# Patient Record
Sex: Female | Born: 1985 | Race: Black or African American | Hispanic: No | Marital: Single | Smoking: Current every day smoker
Health system: Southern US, Community
[De-identification: ages and names within clinical notes are randomized; demographics above are authoritative.]

## PROBLEM LIST (undated history)

## (undated) DIAGNOSIS — R87619 Unspecified abnormal cytological findings in specimens from cervix uteri: Secondary | ICD-10-CM

## (undated) DIAGNOSIS — G40909 Epilepsy, unspecified, not intractable, without status epilepticus: Secondary | ICD-10-CM

## (undated) DIAGNOSIS — F53 Postpartum depression: Secondary | ICD-10-CM

## (undated) DIAGNOSIS — O99345 Other mental disorders complicating the puerperium: Secondary | ICD-10-CM

## (undated) DIAGNOSIS — F419 Anxiety disorder, unspecified: Secondary | ICD-10-CM

## (undated) DIAGNOSIS — IMO0002 Reserved for concepts with insufficient information to code with codable children: Secondary | ICD-10-CM

## (undated) DIAGNOSIS — D573 Sickle-cell trait: Secondary | ICD-10-CM

## (undated) DIAGNOSIS — R569 Unspecified convulsions: Secondary | ICD-10-CM

## (undated) HISTORY — PX: CERVICAL CERCLAGE: SHX1329

## (undated) HISTORY — PX: OTHER SURGICAL HISTORY: SHX169

## (undated) HISTORY — PX: LEEP: SHX91

---

## 2005-10-26 ENCOUNTER — Emergency Department (HOSPITAL_COMMUNITY): Admission: EM | Admit: 2005-10-26 | Discharge: 2005-10-26 | Payer: Self-pay | Admitting: Emergency Medicine

## 2005-10-26 ENCOUNTER — Emergency Department (HOSPITAL_COMMUNITY): Admission: EM | Admit: 2005-10-26 | Discharge: 2005-10-26 | Payer: Self-pay | Admitting: *Deleted

## 2008-01-10 ENCOUNTER — Emergency Department (HOSPITAL_BASED_OUTPATIENT_CLINIC_OR_DEPARTMENT_OTHER): Admission: EM | Admit: 2008-01-10 | Discharge: 2008-01-10 | Payer: Self-pay | Admitting: Emergency Medicine

## 2011-01-02 LAB — OB RESULTS CONSOLE ABO/RH: RH Type: POSITIVE

## 2011-01-02 LAB — OB RESULTS CONSOLE HIV ANTIBODY (ROUTINE TESTING): HIV: NONREACTIVE

## 2011-03-31 NOTE — L&D Delivery Note (Signed)
Delivery Note At 5:14 AM a viable and healthy female was delivered via Vaginal, Spontaneous Delivery (Presentation:LOA->OA ).  APGAR: 8, 9; weight . pending Placenta status: Intact, Spontaneous.  Cord: 3V with the following complications: Marland Kitchen  Marginal insertion  Anesthesia: Epidural  Episiotomy: None Lacerations: 1st degree Suture Repair: 3.0 vicryl rapide Est. Blood Loss (mL): 250  Mom to postpartum.  Baby to nursery-stable.  Kameran Mcneese 08/18/2011, 5:50 AM

## 2011-05-20 LAB — OB RESULTS CONSOLE HIV ANTIBODY (ROUTINE TESTING): HIV: NONREACTIVE

## 2011-08-05 LAB — OB RESULTS CONSOLE GC/CHLAMYDIA: Chlamydia: NEGATIVE

## 2011-08-17 ENCOUNTER — Encounter (HOSPITAL_COMMUNITY): Payer: Self-pay | Admitting: *Deleted

## 2011-08-17 ENCOUNTER — Inpatient Hospital Stay (HOSPITAL_COMMUNITY)
Admission: AD | Admit: 2011-08-17 | Discharge: 2011-08-20 | DRG: 775 | Disposition: A | Discharge status: Home / Self Care | Payer: Medicaid Other | Source: Ambulatory Visit | Attending: Obstetrics & Gynecology | Admitting: Obstetrics & Gynecology

## 2011-08-17 HISTORY — DX: Reserved for concepts with insufficient information to code with codable children: IMO0002

## 2011-08-17 HISTORY — DX: Epilepsy, unspecified, not intractable, without status epilepticus: G40.909

## 2011-08-17 HISTORY — DX: Other mental disorders complicating the puerperium: O99.345

## 2011-08-17 HISTORY — DX: Unspecified convulsions: R56.9

## 2011-08-17 HISTORY — DX: Anxiety disorder, unspecified: F41.9

## 2011-08-17 HISTORY — DX: Sickle-cell trait: D57.3

## 2011-08-17 HISTORY — DX: Postpartum depression: F53.0

## 2011-08-17 HISTORY — DX: Unspecified abnormal cytological findings in specimens from cervix uteri: R87.619

## 2011-08-17 NOTE — MAU Provider Note (Signed)
See Admission H&P for this 26 yo G2P0101 at [redacted]w[redacted]d in active labor

## 2011-08-17 NOTE — MAU Note (Signed)
Arrived via EMS in labor

## 2011-08-18 ENCOUNTER — Encounter (HOSPITAL_COMMUNITY): Payer: Self-pay | Admitting: Anesthesiology

## 2011-08-18 ENCOUNTER — Inpatient Hospital Stay (HOSPITAL_COMMUNITY): Payer: Medicaid Other | Admitting: Anesthesiology

## 2011-08-18 ENCOUNTER — Encounter (HOSPITAL_COMMUNITY): Payer: Self-pay | Admitting: *Deleted

## 2011-08-18 LAB — CBC
HCT: 31.5 % — ABNORMAL LOW (ref 36.0–46.0)
Hemoglobin: 9.9 g/dL — ABNORMAL LOW (ref 12.0–15.0)
MCH: 26.5 pg (ref 26.0–34.0)
MCHC: 31.4 g/dL (ref 30.0–36.0)
MCV: 84.5 fL (ref 78.0–100.0)
RDW: 14 % (ref 11.5–15.5)

## 2011-08-18 MED ORDER — LANOLIN HYDROUS EX OINT: TOPICAL_OINTMENT | CUTANEOUS | Status: DC | PRN

## 2011-08-18 MED ORDER — OXYTOCIN BOLUS FROM INFUSION
500.0000 mL | Freq: Once | INTRAVENOUS | Status: DC
  Filled 2011-08-18: qty 1000
  Filled 2011-08-18: qty 500

## 2011-08-18 MED ORDER — DIPHENHYDRAMINE HCL 50 MG/ML IJ SOLN: 12.5000 mg | INTRAMUSCULAR | Status: DC | PRN

## 2011-08-18 MED ORDER — ONDANSETRON HCL 4 MG/2ML IJ SOLN: 4.0000 mg | Freq: Four times a day (QID) | INTRAMUSCULAR | Status: DC | PRN

## 2011-08-18 MED ORDER — EPHEDRINE 5 MG/ML INJ: 10.0000 mg | INTRAVENOUS | Status: DC | PRN

## 2011-08-18 MED ORDER — DIPHENHYDRAMINE HCL 25 MG PO CAPS: 25.0000 mg | ORAL_CAPSULE | Freq: Four times a day (QID) | ORAL | Status: DC | PRN

## 2011-08-18 MED ORDER — TETANUS-DIPHTH-ACELL PERTUSSIS 5-2.5-18.5 LF-MCG/0.5 IM SUSP
0.5000 mL | Freq: Once | INTRAMUSCULAR | Status: AC
  Administered 2011-08-19: 0.5 mL via INTRAMUSCULAR
  Filled 2011-08-18: qty 0.5

## 2011-08-18 MED ORDER — LAMOTRIGINE 100 MG PO TABS
100.0000 mg | ORAL_TABLET | Freq: Every day | ORAL | Status: DC
  Administered 2011-08-18 – 2011-08-19 (×2): 100 mg via ORAL
  Filled 2011-08-18 (×3): qty 1

## 2011-08-18 MED ORDER — LACTATED RINGERS IV SOLN: 500.0000 mL | INTRAVENOUS | Status: DC | PRN

## 2011-08-18 MED ORDER — PRENATAL MULTIVITAMIN CH
1.0000 | ORAL_TABLET | Freq: Every day | ORAL | Status: DC
  Administered 2011-08-18 – 2011-08-20 (×3): 1 via ORAL
  Filled 2011-08-18 (×3): qty 1

## 2011-08-18 MED ORDER — FENTANYL 2.5 MCG/ML BUPIVACAINE 1/10 % EPIDURAL INFUSION (WH - ANES)
14.0000 mL/h | INTRAMUSCULAR | Status: DC
  Administered 2011-08-18: 14 mL/h via EPIDURAL
  Filled 2011-08-18 (×2): qty 60

## 2011-08-18 MED ORDER — ZOLPIDEM TARTRATE 10 MG PO TABS
10.0000 mg | ORAL_TABLET | Freq: Every evening | ORAL | Status: DC | PRN
  Administered 2011-08-19: 10 mg via ORAL
  Filled 2011-08-18: qty 1

## 2011-08-18 MED ORDER — CITRIC ACID-SODIUM CITRATE 334-500 MG/5ML PO SOLN: 30.0000 mL | ORAL | Status: DC | PRN

## 2011-08-18 MED ORDER — FLEET ENEMA 7-19 GM/118ML RE ENEM: 1.0000 | ENEMA | RECTAL | Status: DC | PRN

## 2011-08-18 MED ORDER — IBUPROFEN 600 MG PO TABS
600.0000 mg | ORAL_TABLET | Freq: Four times a day (QID) | ORAL | Status: DC
  Administered 2011-08-18 – 2011-08-20 (×10): 600 mg via ORAL
  Filled 2011-08-18 (×11): qty 1

## 2011-08-18 MED ORDER — SIMETHICONE 80 MG PO CHEW: 80.0000 mg | CHEWABLE_TABLET | ORAL | Status: DC | PRN

## 2011-08-18 MED ORDER — SENNOSIDES-DOCUSATE SODIUM 8.6-50 MG PO TABS: 2.0000 | ORAL_TABLET | Freq: Every day | ORAL | Status: DC

## 2011-08-18 MED ORDER — SODIUM BICARBONATE 8.4 % IV SOLN
INTRAVENOUS | Status: DC | PRN
  Administered 2011-08-18: 4 mL via EPIDURAL

## 2011-08-18 MED ORDER — WITCH HAZEL-GLYCERIN EX PADS: 1.0000 "application " | MEDICATED_PAD | CUTANEOUS | Status: DC | PRN

## 2011-08-18 MED ORDER — BENZOCAINE-MENTHOL 20-0.5 % EX AERO
1.0000 "application " | INHALATION_SPRAY | CUTANEOUS | Status: DC | PRN
  Filled 2011-08-18: qty 56

## 2011-08-18 MED ORDER — PHENYLEPHRINE 40 MCG/ML (10ML) SYRINGE FOR IV PUSH (FOR BLOOD PRESSURE SUPPORT): 80.0000 ug | PREFILLED_SYRINGE | INTRAVENOUS | Status: DC | PRN

## 2011-08-18 MED ORDER — DIBUCAINE 1 % RE OINT: 1.0000 "application " | TOPICAL_OINTMENT | RECTAL | Status: DC | PRN

## 2011-08-18 MED ORDER — ACETAMINOPHEN 325 MG PO TABS: 650.0000 mg | ORAL_TABLET | ORAL | Status: DC | PRN

## 2011-08-18 MED ORDER — IBUPROFEN 600 MG PO TABS: 600.0000 mg | ORAL_TABLET | Freq: Four times a day (QID) | ORAL | Status: DC | PRN

## 2011-08-18 MED ORDER — OXYTOCIN 20 UNITS IN LACTATED RINGERS INFUSION - SIMPLE: 125.0000 mL/h | Freq: Once | INTRAVENOUS | Status: DC

## 2011-08-18 MED ORDER — ALBUTEROL SULFATE HFA 108 (90 BASE) MCG/ACT IN AERS
2.0000 | INHALATION_SPRAY | RESPIRATORY_TRACT | Status: DC | PRN
  Filled 2011-08-18: qty 6.7

## 2011-08-18 MED ORDER — LACTATED RINGERS IV SOLN
500.0000 mL | Freq: Once | INTRAVENOUS | Status: AC
  Administered 2011-08-18: 500 mL via INTRAVENOUS

## 2011-08-18 MED ORDER — PNEUMOCOCCAL VAC POLYVALENT 25 MCG/0.5ML IJ INJ: 0.5000 mL | INJECTION | Freq: Once | INTRAMUSCULAR | Status: DC

## 2011-08-18 MED ORDER — LIDOCAINE HCL (PF) 1 % IJ SOLN
30.0000 mL | INTRAMUSCULAR | Status: DC | PRN
  Filled 2011-08-18: qty 30

## 2011-08-18 MED ORDER — PNEUMOCOCCAL VAC POLYVALENT 25 MCG/0.5ML IJ INJ
0.5000 mL | INJECTION | Freq: Once | INTRAMUSCULAR | Status: DC
  Filled 2011-08-18: qty 0.5

## 2011-08-18 MED ORDER — ONDANSETRON HCL 4 MG PO TABS: 4.0000 mg | ORAL_TABLET | ORAL | Status: DC | PRN

## 2011-08-18 MED ORDER — OXYCODONE-ACETAMINOPHEN 5-325 MG PO TABS
1.0000 | ORAL_TABLET | ORAL | Status: DC | PRN
  Administered 2011-08-18: 1 via ORAL
  Filled 2011-08-18: qty 1

## 2011-08-18 MED ORDER — OXYCODONE-ACETAMINOPHEN 5-325 MG PO TABS
1.0000 | ORAL_TABLET | ORAL | Status: DC | PRN
  Administered 2011-08-18 – 2011-08-19 (×4): 2 via ORAL
  Administered 2011-08-19 – 2011-08-20 (×3): 1 via ORAL
  Filled 2011-08-18: qty 1
  Filled 2011-08-18: qty 2
  Filled 2011-08-18: qty 1
  Filled 2011-08-18 (×2): qty 2
  Filled 2011-08-18 (×3): qty 1

## 2011-08-18 MED ORDER — LACTATED RINGERS IV SOLN
INTRAVENOUS | Status: DC
  Administered 2011-08-18 (×2): via INTRAVENOUS

## 2011-08-18 MED ORDER — ONDANSETRON HCL 4 MG/2ML IJ SOLN: 4.0000 mg | INTRAMUSCULAR | Status: DC | PRN

## 2011-08-18 MED ORDER — EPHEDRINE 5 MG/ML INJ
10.0000 mg | INTRAVENOUS | Status: DC | PRN
  Filled 2011-08-18: qty 4

## 2011-08-18 MED ORDER — PHENYLEPHRINE 40 MCG/ML (10ML) SYRINGE FOR IV PUSH (FOR BLOOD PRESSURE SUPPORT)
80.0000 ug | PREFILLED_SYRINGE | INTRAVENOUS | Status: DC | PRN
  Filled 2011-08-18: qty 5

## 2011-08-18 NOTE — Anesthesia Procedure Notes (Signed)

## 2011-08-18 NOTE — Anesthesia Postprocedure Evaluation (Signed)
  Anesthesia Post Note  Patient: Sarah Cain  Procedure(s) Performed: * No procedures listed *  Anesthesia type: Epidural  Patient location: Mother/Baby  Post pain: Pain level controlled  Post assessment: Post-op Vital signs reviewed  Last Vitals:  Filed Vitals:   08/18/11 0835  BP: 123/79  Pulse: 85  Temp: 36.4 C  Resp: 18    Post vital signs: Reviewed  Level of consciousness:alert  Complications: No apparent anesthesia complications

## 2011-08-18 NOTE — H&P (Signed)
Attestation of Attending Supervision of Advanced Practitioner: Evaluation and management procedures were performed by the [facility] Fellow/PA/CNM/NP under my supervision and collaboration. Chart reviewed, and agree with management and plan.  Jaynie Collins, M.D. 08/18/2011 7:59 AM

## 2011-08-18 NOTE — Anesthesia Preprocedure Evaluation (Signed)
Anesthesia Evaluation  Patient identified by MRN, date of birth, ID band Patient awake    Reviewed: Allergy & Precautions, H&P , Patient's Chart, lab work & pertinent test results  Airway Mallampati: II TM Distance: >3 FB Neck ROM: full    Dental  (+) Teeth Intact   Pulmonary asthma ,  breath sounds clear to auscultation        Cardiovascular Rhythm:regular Rate:Normal     Neuro/Psych Seizures -,  PSYCHIATRIC DISORDERS    GI/Hepatic   Endo/Other    Renal/GU      Musculoskeletal   Abdominal   Peds  Hematology   Anesthesia Other Findings       Reproductive/Obstetrics (+) Pregnancy                           Anesthesia Physical Anesthesia Plan  ASA: III  Anesthesia Plan: Epidural   Post-op Pain Management:    Induction:   Airway Management Planned:   Additional Equipment:   Intra-op Plan:   Post-operative Plan:   Informed Consent: I have reviewed the patients History and Physical, chart, labs and discussed the procedure including the risks, benefits and alternatives for the proposed anesthesia with the patient or authorized representative who has indicated his/her understanding and acceptance.   Dental Advisory Given  Plan Discussed with:   Anesthesia Plan Comments: (Labs checked- platelets confirmed with RN in room. Fetal heart tracing, per RN, reported to be stable enough for sitting procedure. Discussed epidural, and patient consents to the procedure:  included risk of possible headache,backache, failed block, allergic reaction, and nerve injury. This patient was asked if she had any questions or concerns before the procedure started. )        Anesthesia Quick Evaluation

## 2011-08-18 NOTE — MAU Provider Note (Signed)
Attestation of Attending Supervision of Advanced Practitioner: Evaluation and management procedures were performed by the OB Fellow/PA/CNM/NP under my supervision and collaboration. Chart reviewed, and agree with management and plan.  Jaylenne Hamelin, M.D. 08/18/2011 8:01 AM   

## 2011-08-18 NOTE — Progress Notes (Signed)
UR chart review completed.  

## 2011-08-18 NOTE — H&P (Signed)
Sarah Cain is a 26 y.o. [place] W0981 @ 37'3 by LMP/9wk [place] with a chief complaint of painful ctx, q3 min, since 2100.  Minimal leakage of some fluid earlier today, no gush.  No VB.  +FM.  No CP, SOB, RUQ pain, vision changes, HA or face/hand edema.  Last cervical exam 1 wk ago was 3.5cm.  PN issues: -care at [facility]. -dating by LMP 11/29/10-> EDD 09/05/11, confirmed by 9wk [place]. -h/o 32wk PTD/PPROM/cervical incompetency/cerclage in last delivery: had cerclage this pregnancy 14-36wks.  Tried 17-OHP x 1 dose but could not continue due to logistical problems. -h/o simple partial seizure disorder: stopped dilantin at diagnosis of pregnancy, followed by neurology, was started on lamictal, still having seizures, as often as qday.    History OB History    Grav Para Term Preterm Abortions TAB SAB Ect Mult Living   4 1  1 2 1 1   1   Uncomplicated SVD @ 32wks   Past Medical History  Diagnosis Date  . Asthma   . Abnormal Pap smear   . Epilepsy   . Sickle cell trait   . Anxiety   . Post partum depression   . Seizures     states last seizure was on 5/20  PCOS- has been on OCPs in the past  Past Surgical History  Procedure Date  . Leep   . Dilation and curetage   . Cervical cerclage    GYN Hx: h/o HPV and chlamydia years ago (treated).  No h/o HSV.  H/o abnl pap and LEEP. Family History: positive for sickle cell trait, DM, HTN, CAD, alzheimers. Social History:  reports that she has been smoking Cigarettes- less than one per day.  She does not have any smokeless tobacco history on file. She reports that she does not drink alcohol or use illicit drugs.  Review of Systems  Constitutional: Negative for fever and chills.  HENT: Negative for sore throat.   Eyes: Negative for blurred vision and double vision.  Respiratory: Negative for cough and wheezing.   Cardiovascular: Negative for chest pain and palpitations.  Gastrointestinal: Negative for vomiting.  Genitourinary:  Negative for dysuria and urgency.  Musculoskeletal: Negative for myalgias and joint pain.  Skin: Negative for itching and rash.  Neurological: Positive for dizziness and seizures. Negative for headaches.  Endo/Heme/Allergies: Negative for polydipsia. Does not bruise/bleed easily.    Blood pressure 125/76, pulse 93, temperature 97.9 F (36.6 C), temperature source Oral, resp. rate 20, height 5\' 4"  (1.626 m), weight 87.998 kg (194 lb). Exam Physical Exam  Constitutional: She is oriented to person, place, and time. No distress.  HENT:  Head: Normocephalic and atraumatic.  Eyes: Conjunctivae and EOM are normal.  Neck: Normal range of motion. Neck supple.  Cardiovascular: Normal rate and normal heart sounds.   Respiratory: Effort normal and breath sounds normal.  GI:       Gravid, EFW 6lb, no RUQ tenderness  Genitourinary: Vagina normal.  Musculoskeletal: Normal range of motion.       No pitting edema  Neurological: She is alert and oriented to person, place, and time.  Skin: Skin is warm and dry. She is not diaphoretic.  Psychiatric: She has a normal mood and affect. Her behavior is normal.    SVE: 6.5/100/0/vtx/bulging bag @ 00:20 EFM: baseline 145, moderate variability, +accels, no decels Toco: q 2-3 min  Prenatal labs: ABO, Rh:   B+ Antibody:   neg Rubella:   immune RPR:  NR HBsAg:   neg HIV:   neg GBS:   neg GC/CT: neg GTT: never completed due to nausea  Assessment/Plan: Sarah Cain is a 26 y.o. G4 P0121 @ 37'3 by LMP/9wk [place] admitted in active labor.  -Labor: does not appear to have SROM'd.  Will continue expectant mgmt.  FOB is en route.  Anticipate SVD. -Pain control: desires epidural. -FWB: vtx, GBS negative, cat 1 tracing, term. -h/o simple partial seizure disorder: followed by neurology, on lamictal, still having seizures, as often as qday.  Will continue lamictal.  [name] MD 08/18/2011, 12:28 AM  Saw pt and agree Zyir Gassert 08/18/2011 2:02  AM

## 2011-08-18 NOTE — Progress Notes (Signed)
Subjective: Very comfortable with epidural.  No LOF.  Not feeling ctx.  Objective: BP 106/58  Pulse 96  Temp(Src) 97.7 F (36.5 C) (Oral)  Resp 18  Ht 5\' 4"  (1.626 m)  Wt 87.998 kg (194 lb)  BMI 33.30 kg/m2  SpO2 100%     FHT:  Baseline 140, moderate variability, +accels, no decels UC:   q77min SVE:  7/100/0/vtx/bulging bag.  AROM clear.  Vtx well-applied.  No cord prolapse.  Labs: Lab Results  Component Value Date   WBC 8.8 08/18/2011   HGB 9.9* 08/18/2011   HCT 31.5* 08/18/2011   MCV 84.5 08/18/2011   PLT 158 08/18/2011    Assessment / Plan: Sarah Cain is a 26 y.o. G4 P0121 @ 37'3 by LMP/9wk [place] admitted in active labor.   -Labor: No cervical change over almost 3 hours.  AROM for augmentation.  Will recheck cervix in 2 hours.  Anticipate SVD.   -Pain control: comfortable with epidural.   -FWB: vtx, GBS negative, cat 1 tracing, term.   -h/o simple partial seizure disorder: followed by neurology, on lamictal, still having seizures, as often as qday. Will continue lamictal.   [name] MD  08/18/2011, 3:22 AM  Agree with above Delona Clasby 08/18/2011 5:12 AM

## 2011-08-18 NOTE — MAU Provider Note (Deleted)
Pt sent directly to L&D from MAU for admission.  Please see my H&P for details. Sarah Pauli Oat-Judge MD 

## 2011-08-19 MED ORDER — HYDROXYZINE HCL 25 MG PO TABS
25.0000 mg | ORAL_TABLET | Freq: Three times a day (TID) | ORAL | Status: DC | PRN
  Filled 2011-08-19: qty 1

## 2011-08-19 MED ORDER — IBUPROFEN 600 MG PO TABS: 600.0000 mg | ORAL_TABLET | Freq: Four times a day (QID) | ORAL | Status: AC | PRN

## 2011-08-19 MED ORDER — PNEUMOCOCCAL VAC POLYVALENT 25 MCG/0.5ML IJ INJ
0.5000 mL | INJECTION | Freq: Once | INTRAMUSCULAR | Status: AC
  Administered 2011-08-19: 0.5 mL via INTRAMUSCULAR
  Filled 2011-08-19: qty 0.5

## 2011-08-19 MED ORDER — FLUOXETINE HCL 20 MG PO CAPS
20.0000 mg | ORAL_CAPSULE | Freq: Every day | ORAL | Status: DC
  Administered 2011-08-19 – 2011-08-20 (×2): 20 mg via ORAL
  Filled 2011-08-19 (×3): qty 1

## 2011-08-19 MED ORDER — PNEUMOCOCCAL VAC POLYVALENT 25 MCG/0.5ML IJ INJ: 0.5000 mL | INJECTION | INTRAMUSCULAR | Status: DC

## 2011-08-19 NOTE — Progress Notes (Signed)
I have seen and examined this patient and I agree with the above. Clelia Croft, Mahmoud Blazejewski 8:06 AM 08/19/2011

## 2011-08-19 NOTE — Progress Notes (Signed)
Post Partum Day 1 Subjective: No complaints.  Pain well-controlled on PO meds.  Ambulating.  Voiding.  Tolerating regular diet.  Bottle-feeding, no breast complaints.  Objective: Blood pressure 120/80, pulse 69, temperature 98.1 F (36.7 C), temperature source Oral, resp. rate 18, height 5\' 4"  (1.626 m), weight 87.998 kg (194 lb), SpO2 100.00%, unknown if currently breastfeeding.  Physical Exam:  General: well-appearing, NAD. Uterine Fundus: firm, below fundus. Ext: no calf tenderness.  Non-pitting edema improved from prior.  Labs: No new labs  Assessment/Plan: Sarah Cain is a 26 y.o. G4 P0121 s/p uncomplicated SVD @ 37'3wks no PPD#1, doing well.   -Postpartum course: meeting milestones.  Continue routine care. -Plans bottle feeding and IUD. -h/o simple partial seizure disorder: followed by neurology.  Will continue home dose of lamictal.  -Dispo: anticipate DC home today.  [name] MD  08/19/2011, 7:53 AM
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
## 2011-08-19 NOTE — Discharge Summary (Signed)
Obstetric Discharge Summary Reason for Admission: onset of labor Prenatal Procedures: none Intrapartum Procedures: spontaneous vaginal delivery Postpartum Procedures: none Complications-Operative and Postpartum: 1st degree perineal laceration Hemoglobin  Date Value Range Status  08/18/2011 9.9* 12.0-15.0 (g/dL) Final     HCT  Date Value Range Status  08/18/2011 31.5* 36.0-46.0 (%) Final    Physical Exam:  General: well-appearing, NAD. Uterine Fundus: firm, below fundus. Ext: no calf tenderness.  Non-pitting edema improved from prior.  Discharge Diagnoses: Term Pregnancy-delivered  Discharge Information: Date: 08/19/2011 Activity: pelvic rest Diet: routine Medications: Ibuprofen Condition: stable Instructions: refer to practice specific booklet Discharge to: home   Newborn Data: Live born female  Birth Weight: 6 lb 15.3 oz (3155 g) APGAR: 8, 9  Home with mother.  Hospital Course: Sarah Cain is a 26 y.o. G4 now Z6109 who was admitted in active labor and progressed to achieve uncomplicated SVD @ 37'3wks.  Postpartum course uncomplicated.  Discharged home on PPD#1 in excellent condition.   Sarah Hurter MD  08/19/2011, 7:57 AM  I have seen and examined this patient and I agree with the above. Plans IUD for contraception. Will F/U with OB provider in [place] for PP care. Cam Hai 8:02 AM 08/19/2011

## 2011-08-20 LAB — URINALYSIS, ROUTINE W REFLEX MICROSCOPIC
Bilirubin Urine: NEGATIVE
Ketones, ur: NEGATIVE mg/dL
Nitrite: NEGATIVE
pH: 7.5 (ref 5.0–8.0)

## 2011-08-20 LAB — URINE MICROSCOPIC-ADD ON

## 2011-08-20 MED ORDER — FLUOXETINE HCL 20 MG PO CAPS: 20.0000 mg | ORAL_CAPSULE | Freq: Every day | ORAL | Status: DC

## 2011-08-20 MED ORDER — LAMOTRIGINE 100 MG PO TABS: 100.0000 mg | ORAL_TABLET | Freq: Every day | ORAL | Status: DC

## 2011-08-20 NOTE — Discharge Summary (Signed)
Obstetric Discharge Summary Reason for Admission: onset of labor OB History    Grav Para Term Preterm Abortions TAB SAB Ect Mult Living   4 2 1 1 2 1 1   2      # Outc Date GA Lbr Len/2nd Wgt Sex Del Anes PTL Lv   1 TRM 5/13 [redacted]w[redacted]d 12:01 / 00:13 6lb15.3oz(3.155kg) F SVD EPI  Yes   2 SAB            3 PRE  [redacted]w[redacted]d          4 TAB             Prenatal Procedures: none Intrapartum Procedures: spontaneous vaginal delivery Postpartum Procedures: none Complications-Operative and Postpartum: 1st degree perineal laceration, Post Partum Depression Hemoglobin  Date Value Range Status  08/18/2011 9.9* 12.0-15.0 (g/dL) Final     HCT  Date Value Range Status  08/18/2011 31.5* 36.0-46.0 (%) Final    Physical Exam:  General: alert, cooperative, no distress and fatigued Lochia: appropriate Uterine Fundus: firm Incision:  DVT Evaluation: No evidence of DVT seen on physical exam. Negative Homan's sign. No cords or calf tenderness. No significant calf/ankle edema. PSCYH: Pt reporting mood is depressed.  Concerned regarding ability to provide for baby. No evidence of SI/HI  Discharge Diagnoses: Term Pregnancy-delivered, hx of Post-Partum Depression & Hx of Seizure disorder  Discharge Information: Date: 08/20/2011 Activity: pelvic rest Diet: routine Medications: Ibuprofen Lamictal, Prozac,  Condition: stable Instructions: refer to practice specific booklet Discharge to: home   Newborn Data: Live born female  Birth Weight: 6 lb 15.3 oz (3155 g) APGAR: 8, 9  Home with mother.  Pt was seen and evaluated by social work during this hospitalization.  Pt reported hx of post partum depression as well as current depressed mood.  On Lamictal for seizure disorder.  Started on Prozac during this hospitalization.  SW helping with arranging counseling.  No current or history of SI/HI.  Pt does report being interested in tubal ligation but unsure at this time if wanting additional children in future.   Would like Mirena if not BTL.    Andrena Mews, DO [facility] Resident - PGY-1 08/20/2011 7:41 AM   Patient seen and examined.  Agree with above note.  Levie Heritage, DO 08/20/2011 3:23 PM

## 2011-08-20 NOTE — Progress Notes (Signed)
Post void residual . Voided clear yellow urine in cup for UA. Sent to lab. Abd. Soft non-distended. Showed pt. How to lean forward to make sure bladder was empty. Not to jump up so quick, to attempt to void every 2-3 hrs.

## 2012-05-03 ENCOUNTER — Encounter (HOSPITAL_COMMUNITY): Payer: Self-pay | Admitting: Emergency Medicine

## 2012-05-03 ENCOUNTER — Emergency Department (HOSPITAL_COMMUNITY)
Admission: EM | Admit: 2012-05-03 | Discharge: 2012-05-03 | Disposition: A | Discharge status: Home / Self Care | Payer: BC Managed Care – PPO | Attending: Emergency Medicine | Admitting: Emergency Medicine

## 2012-05-03 DIAGNOSIS — R079 Chest pain, unspecified: Secondary | ICD-10-CM | POA: Insufficient documentation

## 2012-05-03 DIAGNOSIS — J45909 Unspecified asthma, uncomplicated: Secondary | ICD-10-CM | POA: Insufficient documentation

## 2012-05-03 DIAGNOSIS — F17200 Nicotine dependence, unspecified, uncomplicated: Secondary | ICD-10-CM | POA: Insufficient documentation

## 2012-05-03 DIAGNOSIS — R209 Unspecified disturbances of skin sensation: Secondary | ICD-10-CM | POA: Insufficient documentation

## 2012-05-03 DIAGNOSIS — Z79899 Other long term (current) drug therapy: Secondary | ICD-10-CM | POA: Insufficient documentation

## 2012-05-03 DIAGNOSIS — F411 Generalized anxiety disorder: Secondary | ICD-10-CM | POA: Insufficient documentation

## 2012-05-03 DIAGNOSIS — G40909 Epilepsy, unspecified, not intractable, without status epilepticus: Secondary | ICD-10-CM | POA: Insufficient documentation

## 2012-05-03 DIAGNOSIS — Z862 Personal history of diseases of the blood and blood-forming organs and certain disorders involving the immune mechanism: Secondary | ICD-10-CM | POA: Insufficient documentation

## 2012-05-03 LAB — POCT I-STAT, CHEM 8
BUN: 9 mg/dL (ref 6–23)
Calcium, Ion: 1.31 mmol/L — ABNORMAL HIGH (ref 1.12–1.23)
Chloride: 109 mEq/L (ref 96–112)
Potassium: 3.8 mEq/L (ref 3.5–5.1)

## 2012-05-03 MED ORDER — SODIUM CHLORIDE 0.9 % IV SOLN
INTRAVENOUS | Status: DC
  Administered 2012-05-03: 13:00:00 via INTRAVENOUS

## 2012-05-03 MED ORDER — SODIUM CHLORIDE 0.9 % IV SOLN
1000.0000 mg | INTRAVENOUS | Status: AC
  Administered 2012-05-03: 1000 mg via INTRAVENOUS
  Filled 2012-05-03: qty 20

## 2012-05-03 MED ORDER — PHENYTOIN SODIUM EXTENDED 100 MG PO CAPS: 300.0000 mg | ORAL_CAPSULE | Freq: Every day | ORAL | Status: DC

## 2012-05-03 NOTE — ED Notes (Signed)
Per ems- Pt comes from work c/o general weakness, tingling down right side of arm. For 1 month has been off prozac, ran out of seizure meds and has been having seizures x 1 week. Increased anxiety x 1 week. In truck developed central chest "discomfort".  EKG SR, BP 120/60, HR 68. Given 324 aspirin. Ambulatory, a & o x 4.

## 2012-05-03 NOTE — ED Notes (Signed)
Patient speaking with her mother who went to [employer] instead of [facility].  Patient currently giving directions to hospital.

## 2012-05-03 NOTE — ED Provider Notes (Signed)
History     CSN: [id number]  Arrival date & time 05/03/12  1234   First MD Initiated Contact with Patient 05/03/12 1238      Chief Complaint  Patient presents with  . Weakness  . Chest Pain    (Consider location/radiation/quality/duration/timing/severity/associated sxs/prior treatment) Patient is a 27 y.o. female presenting with weakness and chest pain. The history is provided by the patient and the EMS personnel.  Weakness  Additional symptoms include weakness.  Chest Pain  Associated symptoms include weakness.    patient here after having an anxiety episode associated with likely tingling at work. Patient felt that a migraine was beginning in 2 take medication for that and does flow better. No status she has had increased stress but has not been taking her Prozac. Also history of seizure disorder and she has not taken her Dilantin and notes that she did have a seizure on the way to work today. She was counseled to not drive while she is noncompliant with her antiepileptic medication. She has some chest tightness similar to her prior anxiety. No anginal type symptoms. She currently feels back to her baseline  Past Medical History  Diagnosis Date  . Asthma   . Abnormal Pap smear   . Epilepsy   . Sickle cell trait   . Anxiety   . Post partum depression   . Seizures     states last seizure was on 5/20    Past Surgical History  Procedure Date  . Leep   . Dilation and curetage   . Cervical cerclage     No family history on file.  History  Substance Use Topics  . Smoking status: Current Some Day Smoker    Types: Cigarettes  . Smokeless tobacco: Not on file  . Alcohol Use: No    OB History    Grav Para Term Preterm Abortions TAB SAB Ect Mult Living   4 2 1 1 2 1 1   2       Review of Systems  Cardiovascular: Positive for chest pain.  Neurological: Positive for weakness.  All other systems reviewed and are negative.    Allergies  Prednisone  Home  Medications   Current Outpatient Rx  Name  Route  Sig  Dispense  Refill  . ALBUTEROL SULFATE HFA 108 (90 BASE) MCG/ACT IN AERS   Inhalation   Inhale 2 puffs into the lungs every 4 (four) hours as needed.         . CYCLOBENZAPRINE HCL 10 MG PO TABS   Oral   Take 10 mg by mouth 3 (three) times daily as needed. For pain and to relax muscles.         Marland Kitchen FLUOXETINE HCL 20 MG PO CAPS   Oral   Take 1 capsule (20 mg total) by mouth daily.   30 capsule   1   . LAMOTRIGINE 100 MG PO TABS   Oral   Take 1 tablet (100 mg total) by mouth daily.   30 tablet   0   . PRENATAL MULTIVITAMIN CH   Oral   Take 1 tablet by mouth daily.         Marland Kitchen ZOLPIDEM TARTRATE 10 MG PO TABS   Oral   Take 10 mg by mouth at bedtime as needed.           Breastfeeding? Unknown  Physical Exam  Nursing note and vitals reviewed. Constitutional: She is oriented to person, place, and time. She appears well-developed  and well-nourished.  Non-toxic appearance. No distress.  HENT:  Head: Normocephalic and atraumatic.  Eyes: Conjunctivae normal, EOM and lids are normal. Pupils are equal, round, and reactive to light.  Neck: Normal range of motion. Neck supple. No tracheal deviation present. No mass present.  Cardiovascular: Normal rate, regular rhythm and normal heart sounds.  Exam reveals no gallop.   No murmur heard. Pulmonary/Chest: Effort normal and breath sounds normal. No stridor. No respiratory distress. She has no decreased breath sounds. She has no wheezes. She has no rhonchi. She has no rales.  Abdominal: Soft. Normal appearance and bowel sounds are normal. She exhibits no distension. There is no tenderness. There is no rebound and no CVA tenderness.  Musculoskeletal: Normal range of motion. She exhibits no edema and no tenderness.  Neurological: She is alert and oriented to person, place, and time. She has normal strength. No cranial nerve deficit or sensory deficit. GCS eye subscore is 4. GCS verbal  subscore is 5. GCS motor subscore is 6.  Skin: Skin is warm and dry. No abrasion and no rash noted.  Psychiatric: She has a normal mood and affect. Her speech is normal and behavior is normal.    ED Course  Procedures (including critical care time)  Labs Reviewed - No data to display No results found.   No diagnosis found.    MDM  Patient given Dilantin here. She'll be placed on Dilantin discharged home        Toy Baker, MD 05/03/12 1737

## 2012-05-03 NOTE — ED Notes (Signed)
Patient mother at bedside.

## 2012-12-19 ENCOUNTER — Encounter: Payer: Self-pay | Admitting: Neurology

## 2012-12-19 ENCOUNTER — Ambulatory Visit (INDEPENDENT_AMBULATORY_CARE_PROVIDER_SITE_OTHER): Payer: BC Managed Care – PPO | Admitting: Neurology

## 2012-12-19 VITALS — BP 116/74 | HR 74 | Ht 64.0 in | Wt 183.0 lb

## 2012-12-19 DIAGNOSIS — R569 Unspecified convulsions: Secondary | ICD-10-CM | POA: Insufficient documentation

## 2012-12-19 DIAGNOSIS — G43909 Migraine, unspecified, not intractable, without status migrainosus: Secondary | ICD-10-CM
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
## 2012-12-19 NOTE — Patient Instructions (Addendum)
Overall you are doing fairly well but I do want to suggest a few things today:   Remember to drink plenty of fluid, eat healthy meals and do not skip any meals. Try to eat protein with a every meal and eat a healthy snack such as fruit or nuts in between meals. Try to keep a regular sleep-wake schedule and try to exercise daily, particularly in the form of walking, 20-30 minutes a day, if you can.   As far as your medications are concerned, I would like to suggest continuing the Dilantin for now. We will plan to switch this once I review your records.   As far as diagnostic testing: I would like to get a brain MRI.   Please avoid driving until we get your seizures under control  Please have your old neurology records sent to our office.   I would like to see you back in 2 to 3 months, sooner if we need to. Please call us with any interim questions, concerns, problems, updates or refill requests.   Please also call us for any test results so we can go over those with you on the phone.  My clinical assistant and will answer any of your questions and relay your messages to me and also relay most of my messages to you.   Our phone number is [phone]. We also have an after hours call service for urgent matters and there is a physician on-call for urgent questions. For any emergencies you know to call 911 or go to the nearest emergency room

## 2012-12-21 LAB — PHENYTOIN LEVEL, FREE AND TOTAL: Phenytoin, Free: NOT DETECTED ug/mL (ref 1.0–2.0)

## 2012-12-28 ENCOUNTER — Telehealth: Payer: Self-pay | Admitting: Neurology

## 2012-12-30 NOTE — Telephone Encounter (Signed)
I called pt and child answered the phone.  I could not reach the pt.

## 2013-01-11 ENCOUNTER — Other Ambulatory Visit: Payer: BC Managed Care – PPO

## 2013-01-12 ENCOUNTER — Telehealth: Payer: Self-pay | Admitting: *Deleted

## 2013-01-12 NOTE — Telephone Encounter (Signed)
This number does not work either. It asks for an "authorization code". Please let me know what number to use. Thanks.

## 2013-01-18 ENCOUNTER — Ambulatory Visit: Payer: BC Managed Care – PPO

## 2013-01-25 ENCOUNTER — Other Ambulatory Visit: Payer: BC Managed Care – PPO

## 2013-01-31 ENCOUNTER — Telehealth: Payer: Self-pay | Admitting: *Deleted

## 2013-01-31 NOTE — Telephone Encounter (Signed)
Pt called and LMVM for Renee, in MRI about her having to cancel and reschedule MRI, and was also having migraine for 5 days.  I was not sure when this was, just received VM from [facility].  I called and LMVM for pt to return call if still needed.

## 2013-03-13 ENCOUNTER — Telehealth: Payer: Self-pay | Admitting: Neurology

## 2013-03-13 NOTE — Telephone Encounter (Signed)
Patient calling because she wants to be seen today. States she has had a migraine for a week and it is affecting her vision. Please call.

## 2013-03-15 NOTE — Telephone Encounter (Signed)
Please call her and offer her 3:30 on Friday. Thanks.

## 2013-03-15 NOTE — Telephone Encounter (Signed)
Please advise this message from the 03-13-13. This should have been sent to you for review.

## 2013-03-17 ENCOUNTER — Encounter: Payer: Self-pay | Admitting: Neurology

## 2013-03-17 ENCOUNTER — Ambulatory Visit (INDEPENDENT_AMBULATORY_CARE_PROVIDER_SITE_OTHER): Payer: BC Managed Care – PPO | Admitting: Neurology

## 2013-03-17 ENCOUNTER — Encounter (INDEPENDENT_AMBULATORY_CARE_PROVIDER_SITE_OTHER): Payer: Self-pay

## 2013-03-17 VITALS — Ht 65.0 in | Wt 193.0 lb

## 2013-03-17 DIAGNOSIS — R51 Headache: Secondary | ICD-10-CM

## 2013-03-17 DIAGNOSIS — R569 Unspecified convulsions: Secondary | ICD-10-CM

## 2013-03-17 MED ORDER — TOPIRAMATE 50 MG PO TABS: 50.0000 mg | ORAL_TABLET | Freq: Two times a day (BID) | ORAL | Status: DC

## 2013-03-17 MED ORDER — NAPROXEN 250 MG PO TABS: ORAL_TABLET | ORAL | Status: DC

## 2013-03-17 MED ORDER — KETOROLAC TROMETHAMINE 30 MG/ML IJ SOLN: 30.0000 mg | Freq: Once | INTRAMUSCULAR | Status: DC

## 2013-03-17 MED ORDER — KETOROLAC TROMETHAMINE 30 MG/ML IJ SOLN
30.0000 mg | Freq: Once | INTRAMUSCULAR | Status: AC
  Administered 2013-03-17: 30 mg via INTRAMUSCULAR

## 2013-03-17 NOTE — Patient Instructions (Addendum)
Overall you are doing fairly well but I do want to suggest a few things today:   Remember to drink plenty of fluid, eat healthy meals and do not skip any meals. Try to eat protein with a every meal and eat a healthy snack such as fruit or nuts in between meals. Try to keep a regular sleep-wake schedule and try to exercise daily, particularly in the form of walking, 20-30 minutes a day, if you can.   As far as your medications are concerned, I would like to suggest the folllowing: 1)We are going to restart you on the Topamax using the following schedule Week 1: take 1/2 tablet twice a day Week 2: take 1/2 tablet in the morning and 1 whole tablet in the evening Week 3: take 1 whole tablet twice a day  You were given a prescription for Naproxen 250mg . Please take this twice a day, with food, as needed for severe headaches  Please follow up with your eye doctor for a formal eye exam   Please continue to not drive until we get your seizures under control for at least 6 months  I would like to see you back in 3 months, sooner if we need to. Please call us with any interim questions, concerns, problems, updates or refill requests.   My clinical assistant and will answer any of your questions and relay your messages to me and also relay most of my messages to you.   Our phone number is [phone]. We also have an after hours call service for urgent matters and there is a physician on-call for urgent questions. For any emergencies you know to call 911 or go to the nearest emergency room
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
## 2013-03-17 NOTE — Telephone Encounter (Signed)
Patient scheduled.

## 2013-03-17 NOTE — Patient Instructions (Signed)
Patient told she will feel the effect in about a hour.

## 2013-03-17 NOTE — Progress Notes (Signed)
Patient here seeing Dr. Hosie Poisson.  Order for Toradol 30mg  IM.  Under aseptic technique Toradol 30mg  given IM in left gluteal.  Tolerated well.  Bandaid applied.

## 2013-03-24 ENCOUNTER — Emergency Department (HOSPITAL_COMMUNITY)
Admission: EM | Admit: 2013-03-24 | Discharge: 2013-03-24 | Disposition: A | Discharge status: Home / Self Care | Payer: BC Managed Care – PPO | Attending: Emergency Medicine | Admitting: Emergency Medicine

## 2013-03-24 ENCOUNTER — Encounter (HOSPITAL_COMMUNITY): Payer: Self-pay | Admitting: Emergency Medicine

## 2013-03-24 DIAGNOSIS — G43909 Migraine, unspecified, not intractable, without status migrainosus: Secondary | ICD-10-CM

## 2013-03-24 DIAGNOSIS — Z79899 Other long term (current) drug therapy: Secondary | ICD-10-CM | POA: Insufficient documentation

## 2013-03-24 DIAGNOSIS — Z3202 Encounter for pregnancy test, result negative: Secondary | ICD-10-CM | POA: Insufficient documentation

## 2013-03-24 DIAGNOSIS — Z862 Personal history of diseases of the blood and blood-forming organs and certain disorders involving the immune mechanism: Secondary | ICD-10-CM | POA: Insufficient documentation

## 2013-03-24 DIAGNOSIS — J45901 Unspecified asthma with (acute) exacerbation: Secondary | ICD-10-CM | POA: Insufficient documentation

## 2013-03-24 DIAGNOSIS — G40909 Epilepsy, unspecified, not intractable, without status epilepticus: Secondary | ICD-10-CM | POA: Insufficient documentation

## 2013-03-24 DIAGNOSIS — F411 Generalized anxiety disorder: Secondary | ICD-10-CM | POA: Insufficient documentation

## 2013-03-24 DIAGNOSIS — F17200 Nicotine dependence, unspecified, uncomplicated: Secondary | ICD-10-CM | POA: Insufficient documentation

## 2013-03-24 LAB — PHENYTOIN LEVEL, TOTAL: Phenytoin Lvl: <2.5 ug/mL — ABNORMAL LOW (ref 10.0–20.0)

## 2013-03-24 LAB — POCT PREGNANCY, URINE: Preg Test, Ur: NEGATIVE

## 2013-03-24 MED ORDER — SODIUM CHLORIDE 0.9 % IV BOLUS (SEPSIS)
1000.0000 mL | Freq: Once | INTRAVENOUS | Status: AC
  Administered 2013-03-24: 1000 mL via INTRAVENOUS

## 2013-03-24 MED ORDER — DIPHENHYDRAMINE HCL 50 MG/ML IJ SOLN
25.0000 mg | Freq: Once | INTRAMUSCULAR | Status: AC
  Administered 2013-03-24: 25 mg via INTRAVENOUS
  Filled 2013-03-24: qty 1

## 2013-03-24 MED ORDER — KETOROLAC TROMETHAMINE 30 MG/ML IJ SOLN
30.0000 mg | Freq: Once | INTRAMUSCULAR | Status: AC
  Administered 2013-03-24: 30 mg via INTRAVENOUS
  Filled 2013-03-24: qty 1

## 2013-03-24 MED ORDER — METOCLOPRAMIDE HCL 5 MG/ML IJ SOLN
10.0000 mg | Freq: Once | INTRAMUSCULAR | Status: AC
  Administered 2013-03-24: 10 mg via INTRAVENOUS
  Filled 2013-03-24: qty 2

## 2013-03-24 NOTE — ED Provider Notes (Signed)
CSN: [id number]     Arrival date & time 03/24/13  1327 History   First MD Initiated Contact with Patient 03/24/13 1458     Chief Complaint  Patient presents with  . Migraine   (Consider location/radiation/quality/duration/timing/severity/associated sxs/prior Treatment) Patient is a 27 y.o. female presenting with migraines. The history is provided by the patient. No language interpreter was used.  Migraine This is a recurrent problem. The current episode started more than 1 week ago. The problem occurs constantly. The problem has been rapidly worsening. Associated symptoms include headaches and shortness of breath. Pertinent negatives include no chest pain and no abdominal pain. Exacerbated by: light, sound, smells. The symptoms are relieved by lying down (darkness). Treatments tried: topamax, dilantin, oxtcodone. The treatment provided mild relief.    Past Medical History  Diagnosis Date  . Asthma   . Abnormal Pap smear   . Epilepsy   . Sickle cell trait   . Anxiety   . Post partum depression   . Seizures     states last seizure was on 5/20   Past Surgical History  Procedure Laterality Date  . Leep    . Dilation and curetage    . Cervical cerclage     History reviewed. No pertinent family history. History  Substance Use Topics  . Smoking status: Current Every Day Smoker    Types: Cigarettes    Last Attempt to Quit: 02/15/2013  . Smokeless tobacco: Never Used  . Alcohol Use: No   OB History   Grav Para Term Preterm Abortions TAB SAB Ect Mult Living   4 2 1 1 2 1 1   2      Review of Systems  Constitutional: Negative for fever, chills, diaphoresis, activity change, appetite change and fatigue.  HENT: Negative for congestion, facial swelling, rhinorrhea and sore throat.   Eyes: Negative for photophobia and discharge.  Respiratory: Positive for shortness of breath. Negative for cough and chest tightness.   Cardiovascular: Negative for chest pain, palpitations and leg  swelling.  Gastrointestinal: Negative for nausea, vomiting, abdominal pain and diarrhea.  Endocrine: Negative for polydipsia and polyuria.  Genitourinary: Negative for dysuria, frequency, difficulty urinating and pelvic pain.  Musculoskeletal: Negative for arthralgias, back pain, neck pain and neck stiffness.  Skin: Negative for color change and wound.  Allergic/Immunologic: Negative for immunocompromised state.  Neurological: Positive for headaches. Negative for facial asymmetry, weakness and numbness.  Hematological: Does not bruise/bleed easily.  Psychiatric/Behavioral: Negative for confusion and agitation.    Allergies  Prednisone  Home Medications   Current Outpatient Rx  Name  Route  Sig  Dispense  Refill  . phenytoin (DILANTIN) 100 MG ER capsule   Oral   Take 3 capsules (300 mg total) by mouth at bedtime.   90 capsule   0   . topiramate (TOPAMAX) 50 MG tablet   Oral   Take 1 tablet (50 mg total) by mouth 2 (two) times daily.   60 tablet   3   . albuterol (PROVENTIL HFA;VENTOLIN HFA) 108 (90 BASE) MCG/ACT inhaler   Inhalation   Inhale 2 puffs into the lungs every 4 (four) hours as needed.          BP 109/68  Pulse 68  Temp(Src) 97.7 F (36.5 C) (Oral)  Resp 16  SpO2 100% Physical Exam  Constitutional: She is oriented to person, place, and time. She appears well-developed and well-nourished. No distress.  HENT:  Head: Normocephalic and atraumatic.  Mouth/Throat: No oropharyngeal exudate.  Eyes: Pupils are equal, round, and reactive to light.  Neck: Normal range of motion. Neck supple.  Cardiovascular: Normal rate, regular rhythm and normal heart sounds.  Exam reveals no gallop and no friction rub.   No murmur heard. Pulmonary/Chest: Effort normal and breath sounds normal. No respiratory distress. She has no wheezes. She has no rales.  Abdominal: Soft. Bowel sounds are normal. She exhibits no distension and no mass. There is no tenderness. There is no rebound  and no guarding.  Musculoskeletal: Normal range of motion. She exhibits no edema and no tenderness.  Neurological: She is alert and oriented to person, place, and time. She has normal strength. She displays no tremor. No cranial nerve deficit or sensory deficit. She exhibits normal muscle tone. She displays a negative Romberg sign. Coordination and gait normal. GCS eye subscore is 4. GCS verbal subscore is 5. GCS motor subscore is 6.  Skin: Skin is warm and dry.  Psychiatric: She has a normal mood and affect.    ED Course  Procedures (including critical care time) Labs Review Labs Reviewed  PHENYTOIN LEVEL, TOTAL - Abnormal; Notable for the following:    Phenytoin Lvl <2.5 (*)    All other components within normal limits  POCT PREGNANCY, URINE   Imaging Review No results found.  EKG Interpretation   None       MDM   1. Migraine    Pt is a 27 y.o. female with Pmhx as above who presents with 2 weeks of migraine symptoms described as frontal sharp, aching, throbbing, wworse w/ light, smell, sound, mvmt of head.  NO n/v, d/a, fever, neck stiffness. She was seen at [facility] about 1 week ago had LP and CT head with were unremarkable.  Pain not worse w/ standing or improved w/ laying down c/w CSF leak.  On PE, VSS, pt in NAD, NO focal neuro findings.   No overlying skin changes to LP site.  Pt's symptoms much improved after IVF, reglan & benadryl.  Doubt CVA, TIA< SAH, meningitis. Phenytoin level ordered but still pending at time of d/c, likely to be low as pt just restarted on meds.  Return precautions given for new or worsening symptoms including fever, focal neuro symptoms. She is encouraged to establish close f/u with her neurologist.          Shanna Cisco, MD 03/25/13 1157

## 2013-03-24 NOTE — ED Notes (Addendum)
Per EMS here with migraine, states has had for one week, was at [facility] ED on Wednesday with migraines and had witnessed seizure, pt states not better since forsyth, states affecting vision

## 2013-04-05 ENCOUNTER — Ambulatory Visit: Payer: BC Managed Care – PPO | Admitting: Neurology

## 2013-04-10 ENCOUNTER — Ambulatory Visit: Payer: BC Managed Care – PPO | Admitting: Neurology

## 2013-04-12 ENCOUNTER — Telehealth: Payer: Self-pay | Admitting: Neurology

## 2013-04-12 ENCOUNTER — Ambulatory Visit: Payer: BC Managed Care – PPO | Admitting: Neurology

## 2013-04-12 NOTE — Telephone Encounter (Signed)
Patient was no show for 04/12/2013 appointment

## 2013-04-14 ENCOUNTER — Encounter: Payer: Self-pay | Admitting: Neurology

## 2013-04-24 ENCOUNTER — Telehealth: Payer: Self-pay | Admitting: Neurology

## 2013-04-24 NOTE — Telephone Encounter (Signed)
Spoke with patient and she said that she was asleep, awaken with her left hand twitching, eyes were still closed but her eyes were moving, rolled over and started shaking. Patient had a weird feeling and doesn't know how long it lasted. This was her 3rd seizure in a month.

## 2013-04-24 NOTE — Telephone Encounter (Signed)
Please call her and ask her to come in on Tuesday or Wednesday during one of the follow up slots. Thanks.

## 2013-04-24 NOTE — Telephone Encounter (Signed)
Pt called wanting an emergency appt because she had a seizure in the middle of the night lastnight. Didn't give any details.

## 2013-04-24 NOTE — Telephone Encounter (Signed)
Called patient for more information on seizure, lt VM message

## 2013-04-24 NOTE — Telephone Encounter (Signed)
Patient has been scheduled for Tuesday/confirmed

## 2013-04-25 ENCOUNTER — Ambulatory Visit (INDEPENDENT_AMBULATORY_CARE_PROVIDER_SITE_OTHER): Payer: BC Managed Care – PPO | Admitting: Neurology

## 2013-04-25 ENCOUNTER — Encounter (INDEPENDENT_AMBULATORY_CARE_PROVIDER_SITE_OTHER): Payer: Self-pay

## 2013-04-25 ENCOUNTER — Encounter: Payer: Self-pay | Admitting: Neurology

## 2013-04-25 VITALS — BP 114/75 | HR 90 | Ht 65.5 in | Wt 192.0 lb

## 2013-04-25 DIAGNOSIS — R569 Unspecified convulsions: Secondary | ICD-10-CM

## 2013-04-25 MED ORDER — PHENYTOIN SODIUM EXTENDED 300 MG PO CAPS: 300.0000 mg | ORAL_CAPSULE | Freq: Every day | ORAL | Status: DC

## 2013-04-25 MED ORDER — TOPIRAMATE 25 MG PO TABS: 25.0000 mg | ORAL_TABLET | Freq: Two times a day (BID) | ORAL | Status: DC

## 2013-04-25 NOTE — Patient Instructions (Signed)
Overall you are doing fairly well but I do want to suggest a few things today:   As far as your medications are concerned, I would like to suggest the following: 1)Change your Dilantin to a 300mg  ER capsule once nightly 2)Increase Topamax to 75mg  twice a day. You will take a 50mg  and a 25mg  capsule twice a day  I would like you to follow up with the [facility] for ambulatory EEG monitoring. Someone will call you to schedule this  I would like to see you back after you [facility] and complete the ambulatory EEG. Please call us with any interim questions, concerns, problems, updates or refill requests.   Please also call us for any test results so we can go over those with you on the phone.  My clinical assistant and will answer any of your questions and relay your messages to me and also relay most of my messages to you.   Our phone number is [phone]. We also have an after hours call service for urgent matters and there is a physician on-call for urgent questions. For any emergencies you know to call 911 or go to the nearest emergency room
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
## 2013-04-26 ENCOUNTER — Telehealth: Payer: Self-pay | Admitting: Neurology

## 2013-04-26 NOTE — Telephone Encounter (Signed)
FYI--DILANTIN LEVEL 5.7--DRAWN 04-24-12-NO RETURN CALL NEEDED ONLY IF YOU HAVE QUESTIONS

## 2013-05-15 ENCOUNTER — Telehealth: Payer: Self-pay | Admitting: Neurology

## 2013-05-15 NOTE — Telephone Encounter (Signed)
Called patient concerning the message that she left about needing a note stating that she could return to work, because patient had a seizure at work and now her work needs a statement stating that she is okay to return. Please advise.

## 2013-05-15 NOTE — Telephone Encounter (Signed)
Please find out from her if she followed up with [facility] to have prolonged EEG monitoring as instructed. Please also let her know she needs to have her dilantin level checked. I will place the order for dilantin level and she can stop by the lab at her convenience. Thanks.

## 2013-05-15 NOTE — Telephone Encounter (Signed)
Pt states she had a seizure at work on Friday and is in need of a note to return to work. Pt did not seek medical help states its to expensive to go to the ER States they will not let her return to work without it. Please call.

## 2013-05-17 ENCOUNTER — Other Ambulatory Visit: Payer: Self-pay | Admitting: Neurology

## 2013-05-17 DIAGNOSIS — R569 Unspecified convulsions: Secondary | ICD-10-CM

## 2013-05-17 NOTE — Telephone Encounter (Signed)
Called patient to inform her that Dr. Hosie PoissonSumner wanted to check her dilantin level first to see if it needs adjusting before he would clear her to go back to work. Patient verbalized understanding.

## 2013-05-17 NOTE — Telephone Encounter (Signed)
Please let her know that I want to see what her dilantin level is and if it needs to be adjusted. Once I get the level and make adjustments I will likely be able to clear her to return to work. Thanks.

## 2013-05-17 NOTE — Telephone Encounter (Signed)
Called patient and patient stated that she is still wanted to hear from someone about an appt to [facility] have [facility] EEG [facility]. Patient also stated that she was referred to a headache [facility] and she had a OV yesterday and the specialist up her topirmate to 100 mg, patient wanted to let you know. I informed the patient that Dr. Hosie PoissonSumner had order her dilantin level checked and patient stated that she would be coming be Friday. Patient wanted to know when she could go back to work. Please advise.
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
## 2013-05-26 ENCOUNTER — Telehealth: Payer: Self-pay | Admitting: Neurology

## 2013-05-26 NOTE — Telephone Encounter (Signed)
Patient calling back to discuss lab results. Please advise.

## 2013-05-26 NOTE — Telephone Encounter (Signed)
Called patient and patient stated that she had her [facility]. I advised the patient to call [facility] and have them fax over the results to Dr. Hosie PoissonSumner. Patient stated that she would have that done right away. I advised the patient the if she has any other problems, questions or concerns to call the office. Patient verbalized understanding.

## 2013-05-26 NOTE — Telephone Encounter (Signed)
Pt called wants Dr. Hosie PoissonSumner or his nurse to call her back concerning her lab results.

## 2013-05-26 NOTE — Telephone Encounter (Signed)
We do not have any current lab results from her. Can we please find out where she had lab work drawn and get it faxed over here. Thanks.

## 2013-05-29 ENCOUNTER — Encounter: Payer: Self-pay | Admitting: Neurology

## 2013-05-29 NOTE — Telephone Encounter (Signed)
Lab results to in box.  (Free phenytoin - none detected).

## 2013-05-29 NOTE — Telephone Encounter (Signed)
Pt called back is wanting Dr. Minus BreedingSumner's nurse to return her call at the home or cell. Pt wants to make sure we received pt's lab results from [facility] and what is the next step to getting pt back to work.

## 2013-05-29 NOTE — Telephone Encounter (Signed)
Called patient back and discussed lab results. Patient had briefly stopped taking medication due to running out of medication. This likely contributed to sub-therapeutic level and breakthrough seizure. Patient currently back on medication. Cleared to return to work, counseled her that she needs to avoid driving for minimum of 6 months

## 2013-05-29 NOTE — Telephone Encounter (Signed)
Called for lab results [phone].  Are to fax results.

## 2013-06-13 ENCOUNTER — Encounter: Payer: Self-pay | Admitting: Neurology

## 2013-06-15 ENCOUNTER — Ambulatory Visit: Payer: BC Managed Care – PPO | Admitting: Neurology

## 2013-06-15 NOTE — Telephone Encounter (Signed)
No show for follow up appointment on 06/15/2013

## 2013-12-07 ENCOUNTER — Encounter (HOSPITAL_COMMUNITY): Payer: Self-pay | Admitting: Emergency Medicine

## 2013-12-07 ENCOUNTER — Emergency Department (HOSPITAL_COMMUNITY): Payer: BC Managed Care – PPO

## 2013-12-07 ENCOUNTER — Emergency Department (HOSPITAL_COMMUNITY)
Admission: EM | Admit: 2013-12-07 | Discharge: 2013-12-07 | Disposition: A | Discharge status: Home / Self Care | Payer: BC Managed Care – PPO | Attending: Emergency Medicine | Admitting: Emergency Medicine

## 2013-12-07 DIAGNOSIS — F411 Generalized anxiety disorder: Secondary | ICD-10-CM | POA: Diagnosis not present

## 2013-12-07 DIAGNOSIS — R0989 Other specified symptoms and signs involving the circulatory and respiratory systems: Secondary | ICD-10-CM | POA: Insufficient documentation

## 2013-12-07 DIAGNOSIS — J45901 Unspecified asthma with (acute) exacerbation: Secondary | ICD-10-CM | POA: Insufficient documentation

## 2013-12-07 DIAGNOSIS — Z79899 Other long term (current) drug therapy: Secondary | ICD-10-CM | POA: Insufficient documentation

## 2013-12-07 DIAGNOSIS — R0609 Other forms of dyspnea: Secondary | ICD-10-CM | POA: Insufficient documentation

## 2013-12-07 DIAGNOSIS — R059 Cough, unspecified: Secondary | ICD-10-CM

## 2013-12-07 DIAGNOSIS — F3289 Other specified depressive episodes: Secondary | ICD-10-CM | POA: Diagnosis not present

## 2013-12-07 DIAGNOSIS — F329 Major depressive disorder, single episode, unspecified: Secondary | ICD-10-CM | POA: Insufficient documentation

## 2013-12-07 DIAGNOSIS — R05 Cough: Secondary | ICD-10-CM

## 2013-12-07 DIAGNOSIS — R0602 Shortness of breath: Secondary | ICD-10-CM

## 2013-12-07 DIAGNOSIS — G40909 Epilepsy, unspecified, not intractable, without status epilepticus: Secondary | ICD-10-CM | POA: Diagnosis not present

## 2013-12-07 DIAGNOSIS — F17200 Nicotine dependence, unspecified, uncomplicated: Secondary | ICD-10-CM | POA: Diagnosis not present

## 2013-12-07 DIAGNOSIS — Z862 Personal history of diseases of the blood and blood-forming organs and certain disorders involving the immune mechanism: Secondary | ICD-10-CM | POA: Insufficient documentation

## 2013-12-07 LAB — BASIC METABOLIC PANEL
Anion gap: 14 (ref 5–15)
BUN: 8 mg/dL (ref 6–23)
CHLORIDE: 100 meq/L (ref 96–112)
CO2: 24 mEq/L (ref 19–32)
Calcium: 9.4 mg/dL (ref 8.4–10.5)
Creatinine, Ser: 0.87 mg/dL (ref 0.50–1.10)
GFR calc Af Amer: >90 mL/min (ref >90)
Glucose, Bld: 101 mg/dL — ABNORMAL HIGH (ref 70–99)
Potassium: 3.4 mEq/L — ABNORMAL LOW (ref 3.7–5.3)
SODIUM: 138 meq/L (ref 137–147)

## 2013-12-07 LAB — D-DIMER, QUANTITATIVE (NOT AT ARMC)

## 2013-12-07 LAB — CBC WITH DIFFERENTIAL/PLATELET
Basophils Absolute: 0 10*3/uL (ref 0.0–0.1)
Basophils Relative: 0 % (ref 0–1)
EOS ABS: 0.2 10*3/uL (ref 0.0–0.7)
Eosinophils Relative: 2 % (ref 0–5)
HCT: 40 % (ref 36.0–46.0)
Hemoglobin: 13.3 g/dL (ref 12.0–15.0)
LYMPHS ABS: 2.3 10*3/uL (ref 0.7–4.0)
LYMPHS PCT: 26 % (ref 12–46)
MCH: 29.9 pg (ref 26.0–34.0)
MCHC: 33.3 g/dL (ref 30.0–36.0)
MCV: 89.9 fL (ref 78.0–100.0)
Monocytes Absolute: 0.8 10*3/uL (ref 0.1–1.0)
Monocytes Relative: 9 % (ref 3–12)
NEUTROS ABS: 5.5 10*3/uL (ref 1.7–7.7)
NEUTROS PCT: 63 % (ref 43–77)
PLATELETS: 227 10*3/uL (ref 150–400)
RBC: 4.45 MIL/uL (ref 3.87–5.11)
RDW: 12.9 % (ref 11.5–15.5)
WBC: 8.9 10*3/uL (ref 4.0–10.5)

## 2013-12-07 LAB — TROPONIN I: Troponin I: <0.3 ng/mL (ref <0.30)

## 2013-12-07 LAB — HCG, SERUM, QUALITATIVE: Preg, Serum: NEGATIVE

## 2013-12-07 MED ORDER — PROCHLORPERAZINE EDISYLATE 5 MG/ML IJ SOLN
10.0000 mg | Freq: Once | INTRAMUSCULAR | Status: AC
  Administered 2013-12-07: 10 mg via INTRAVENOUS
  Filled 2013-12-07: qty 2

## 2013-12-07 MED ORDER — SODIUM CHLORIDE 0.9 % IV BOLUS (SEPSIS)
1000.0000 mL | Freq: Once | INTRAVENOUS | Status: AC
  Administered 2013-12-07: 1000 mL via INTRAVENOUS

## 2013-12-07 MED ORDER — IPRATROPIUM-ALBUTEROL 0.5-2.5 (3) MG/3ML IN SOLN
3.0000 mL | Freq: Once | RESPIRATORY_TRACT | Status: AC
Start: 2013-12-07 — End: 2013-12-07
  Administered 2013-12-07: 3 mL via RESPIRATORY_TRACT
  Filled 2013-12-07: qty 3

## 2013-12-07 MED ORDER — DIPHENHYDRAMINE HCL 50 MG/ML IJ SOLN
25.0000 mg | Freq: Once | INTRAMUSCULAR | Status: AC
  Administered 2013-12-07: 25 mg via INTRAVENOUS
  Filled 2013-12-07: qty 1

## 2013-12-07 MED ORDER — METHYLPREDNISOLONE SODIUM SUCC 125 MG IJ SOLR
125.0000 mg | Freq: Once | INTRAMUSCULAR | Status: AC
  Administered 2013-12-07: 125 mg via INTRAVENOUS
  Filled 2013-12-07: qty 2

## 2013-12-07 NOTE — ED Provider Notes (Signed)
CSN: [id number]     Arrival date & time 12/07/13  1330 History   First MD Initiated Contact with Patient 12/07/13 1338     Chief Complaint  Patient presents with  . Respiratory Distress     (Consider location/radiation/quality/duration/timing/severity/associated sxs/prior Treatment) HPI  Shanyce Daris is a 28 y.o. female  past medical history significant for asthma, epilepsy and anxiety part in by EMS for wheezing and shortness of breath. Patient states that she has had an upper respiratory infection with dry cough) he knows for 2 days. She has sick contacts in her children. She saw her primary care provider who gave her a albuterol treatment in the office which helped her yesterday. Patient denies any history of intubations or hospitalizations for her asthma, fever, chills, abdominal pain, nausea vomiting, change in bowel or bladder habits. Patient states she's also having pain on her left side down the arm and into the shoulder. States that this is typical for her anxiety attacks however she is also having perioral numbness. Patient states that she's had a migraine exacerbation for the last several days and has nausea which is typically associated with her migraine.  Past Medical History  Diagnosis Date  . Asthma   . Abnormal Pap smear   . Epilepsy   . Sickle cell trait   . Anxiety   . Post partum depression   . Seizures     states last seizure was on 5/20   Past Surgical History  Procedure Laterality Date  . Leep    . Dilation and curetage    . Cervical cerclage     No family history on file. History  Substance Use Topics  . Smoking status: Current Every Day Smoker    Types: Cigarettes    Last Attempt to Quit: 02/15/2013  . Smokeless tobacco: Never Used  . Alcohol Use: No   OB History   Grav Para Term Preterm Abortions TAB SAB Ect Mult Living   Review of Systems  10 systems reviewed and found to be negative, except as noted in the  HPI.   Allergies  Prednisone  Home Medications   Prior to Admission medications   Medication Sig Start Date End Date Taking? Authorizing Provider  albuterol (PROVENTIL HFA;VENTOLIN HFA) 108 (90 BASE) MCG/ACT inhaler Inhale 2 puffs into the lungs every 4 (four) hours as needed.   Yes Historical Provider, MD  atenolol (TENORMIN) 25 MG tablet Take 25 mg by mouth daily.   Yes Historical Provider, MD  cyclobenzaprine (FLEXERIL) 10 MG tablet Take 10 mg by mouth 3 (three) times daily as needed for muscle spasms.   Yes Historical Provider, MD  gabapentin (NEURONTIN) 300 MG capsule Take 300 mg by mouth at bedtime.   Yes Historical Provider, MD  promethazine (PHENERGAN) 25 MG tablet Take 25 mg by mouth every 6 (six) hours as needed for nausea or vomiting.   Yes Historical Provider, MD  QUEtiapine (SEROQUEL) 400 MG tablet Take 400 mg by mouth at bedtime.   Yes Historical Provider, MD  topiramate (TOPAMAX) 100 MG tablet Take 100-150 mg by mouth 2 (two) times daily. Takes 1 tablet in am and 1 1/2 tablet in pm   Yes Historical Provider, MD  zolpidem (AMBIEN) 5 MG tablet Take 5 mg by mouth at bedtime as needed for sleep.   Yes Historical Provider, MD  albuterol (PROVENTIL HFA;VENTOLIN HFA) 108 (90 BASE) MCG/ACT inhaler 1-2 puffs. 11/12/12  11/12/13  Historical Provider, MD   BP 124/63  Pulse 104  Temp(Src) 97.3 F (36.3 C) (Oral)  Resp 20  SpO2 100%  LMP 11/30/2013 Physical Exam  Nursing note and vitals reviewed. Constitutional: She is oriented to person, place, and time. She appears well-developed and well-nourished. No distress.  HENT:  Head: Normocephalic and atraumatic.  Mouth/Throat: Oropharynx is clear and moist.  Eyes: Conjunctivae and EOM are normal. Pupils are equal, round, and reactive to light.  Neck: Normal range of motion. Neck supple.  Cardiovascular: Normal rate, regular rhythm and intact distal pulses.   Pulmonary/Chest: Effort normal and breath sounds normal. No stridor. No  respiratory distress. She has no wheezes. She has no rales. She exhibits no tenderness.  Reclining comfortably, speaking in complete sentences. No stridor. Excellent air movement in all fields with no wheezing  Abdominal: Soft. There is no tenderness.  Musculoskeletal: Normal range of motion.  No calf asymmetry, superficial collaterals, palpable cords, edema, Homans sign negative bilaterally.    Neurological: She is alert and oriented to person, place, and time.  Psychiatric:  Anxious    ED Course  Procedures (including critical care time) Labs Review Labs Reviewed  BASIC METABOLIC PANEL - Abnormal; Notable for the following:    Potassium 3.4 (*)    Glucose, Bld 101 (*)    All other components within normal limits  CBC WITH DIFFERENTIAL  HCG, SERUM, QUALITATIVE  D-DIMER, QUANTITATIVE  TROPONIN I    Imaging Review Dg Chest 2 View  12/07/2013   CLINICAL DATA:  Respiratory distress.  EXAM: CHEST  2 VIEW  COMPARISON:  PA and lateral chest 10/26/2005.  FINDINGS: Lung volumes are low but the lungs are clear. Heart size is normal. No pneumothorax or pleural effusion. No focal bony abnormality.  IMPRESSION: Negative chest.   Electronically Signed   By: Drusilla Kanner M.D.   On: 12/07/2013 14:02     EKG Interpretation None      MDM   Final diagnoses:  None    Filed Vitals:   12/07/13 1435 12/07/13 1444 12/07/13 1445 12/07/13 1530  BP: 115/71  122/84 124/63  Pulse: 103  82 104  Temp:   97.3 F (36.3 C)   TempSrc:   Oral   Resp:   18 20  SpO2: 100% 98% 98% 100%    Medications  prochlorperazine (COMPAZINE) injection 10 mg (10 mg Intravenous Given 12/07/13 1459)  diphenhydrAMINE (BENADRYL) injection 25 mg (25 mg Intravenous Given 12/07/13 1458)  sodium chloride 0.9 % bolus 1,000 mL (0 mLs Intravenous Stopped 12/07/13 1601)  methylPREDNISolone sodium succinate (SOLU-MEDROL) 125 mg/2 mL injection 125 mg (125 mg Intravenous Given 12/07/13 1458)  ipratropium-albuterol (DUONEB)  0.5-2.5 (3) MG/3ML nebulizer solution 3 mL (3 mLs Nebulization Given 12/07/13 1458)    Anjalina Bergevin is a 28 y.o. female presenting with shortness of breath. On my exam patient has perfectly clear lung sounds with excellent air movement in all fields. She's already received one nebulizer in route. Patient will be given duo neb and Solu-Medrol she is also complaining of headache I will give her a headache cocktail as well. Patient is tachycardic and on further discussion I see that she has had a six-hour Road trip approximately 2 weeks ago. Physical exam is not suggestive of DVT but do to her unexplained shortness of breath and tachycardia we'll obtain a d-dimer. Case signed out to PA lawyer at shift change. Plan is to followup dimer and troponin.  Wynetta Emery, PA-C 12/08/13 [phone]

## 2013-12-07 NOTE — Discharge Instructions (Signed)
Return here as needed. Follow up with your doctor. °

## 2013-12-07 NOTE — ED Notes (Signed)
28 yo female from home asthma exac this morning took neb resolved. Per EMS pt had Expiratory wheezes in all fields. 5 of albuterol resolved issue. Pt is anxious. Alert oriented x3  Hx: anxiety takes klonopin and Seizures  Vitals  116/76 98 HR 100% Neb

## 2013-12-11 NOTE — ED Provider Notes (Signed)
Medical screening examination/treatment/procedure(s) were performed by non-physician practitioner and as supervising physician I was immediately available for consultation/collaboration.   EKG Interpretation   Date/Time:  Thursday December 07 2013 15:06:25 EDT Ventricular Rate:  97 PR Interval:  156 QRS Duration: 87 QT Interval:  351 QTC Calculation: 446 R Axis:   53 Text Interpretation:  Sinus rhythm ED PHYSICIAN INTERPRETATION AVAILABLE  IN [facility] Confirmed by TEST, Record ([id number]) on 12/09/2013 4:47:29  PM        Jonny Ruiz Young Berry III, MD 12/11/13 [id number]

## 2014-01-29 ENCOUNTER — Encounter (HOSPITAL_COMMUNITY): Payer: Self-pay | Admitting: Emergency Medicine

## 2015-01-24 NOTE — Telephone Encounter (Signed)
Error

## 2016-01-20 IMAGING — CR DG CHEST 2V
2 series · 2 of 2 positions shown · non-contrast
Comparison: PA and lateral chest 10/26/2005.

CLINICAL DATA: Respiratory distress.

EXAM:
CHEST  2 VIEW

[w chest pa]
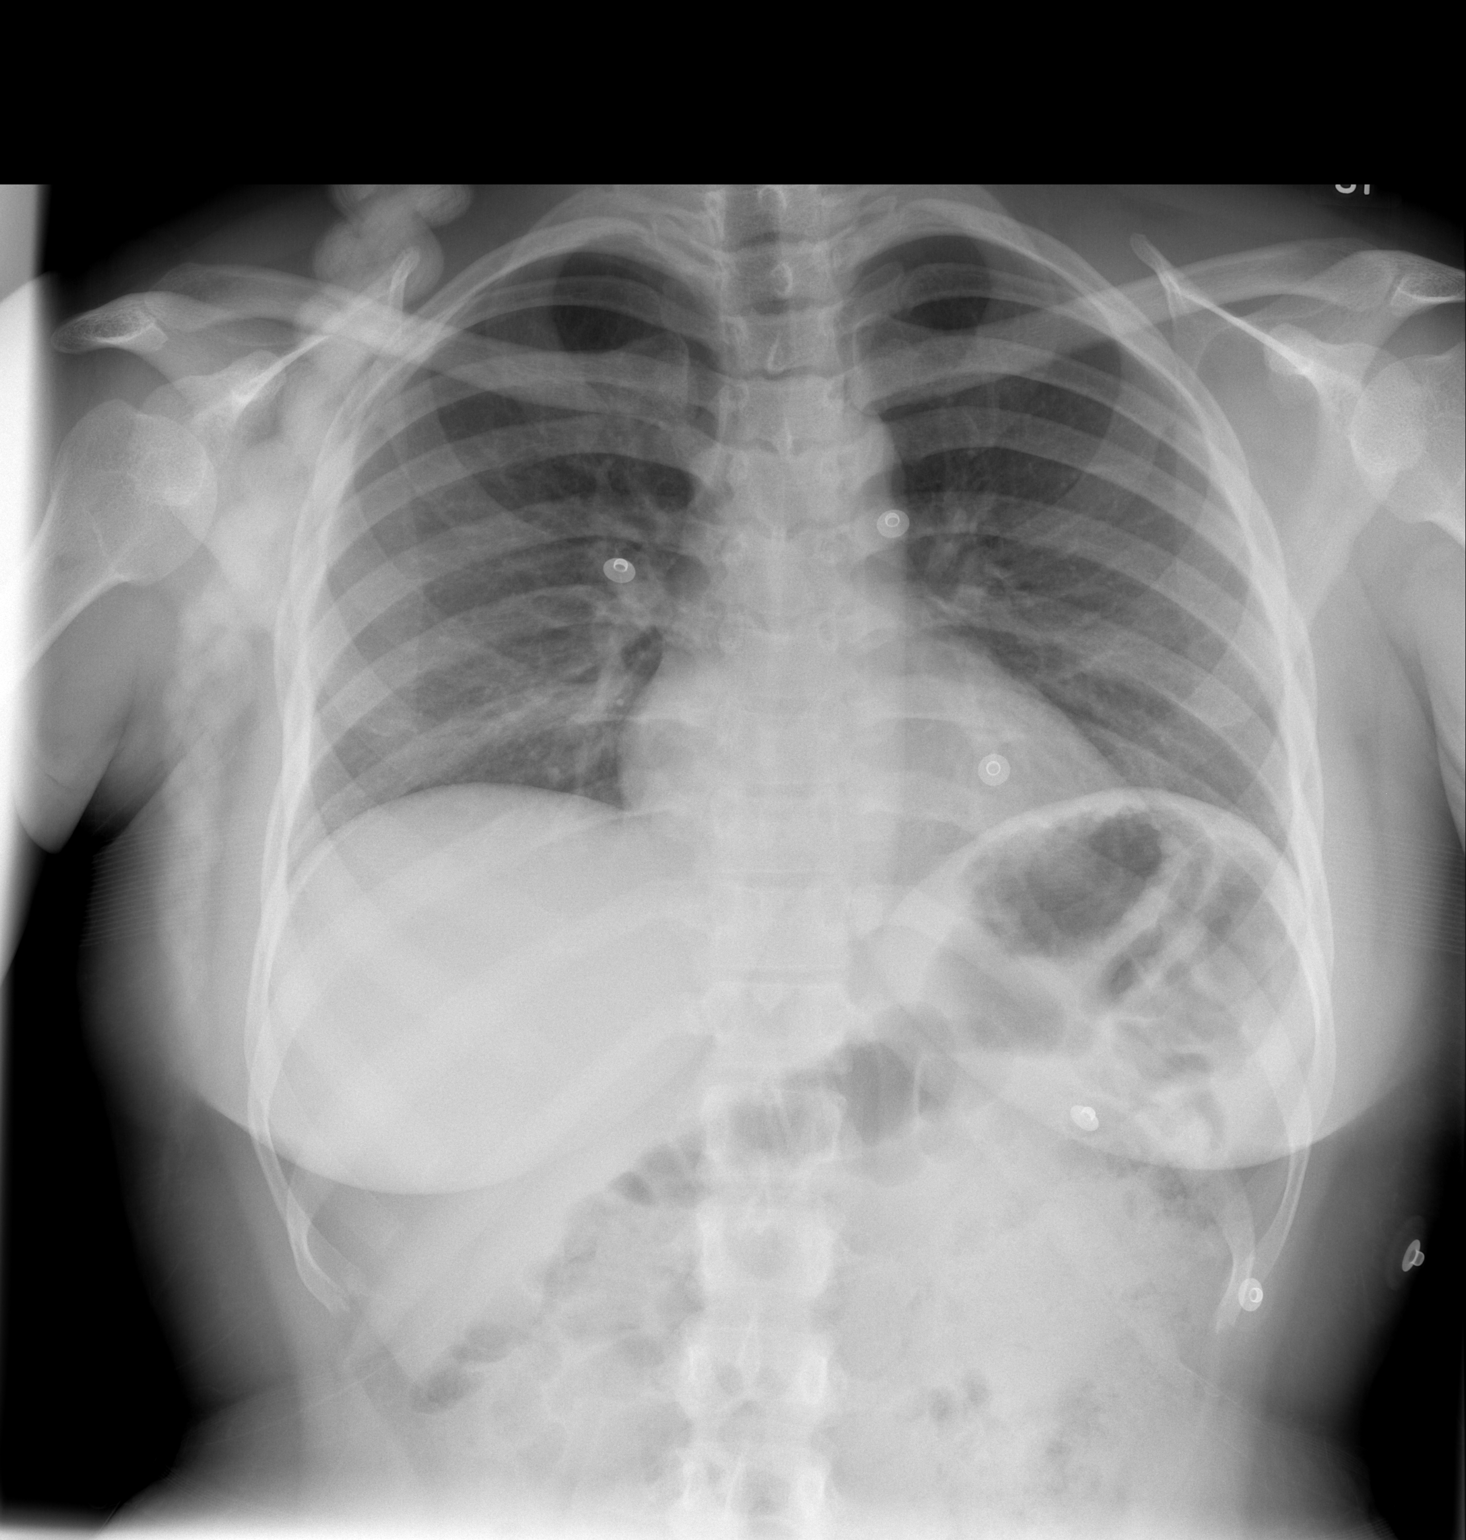

[w chest lat]
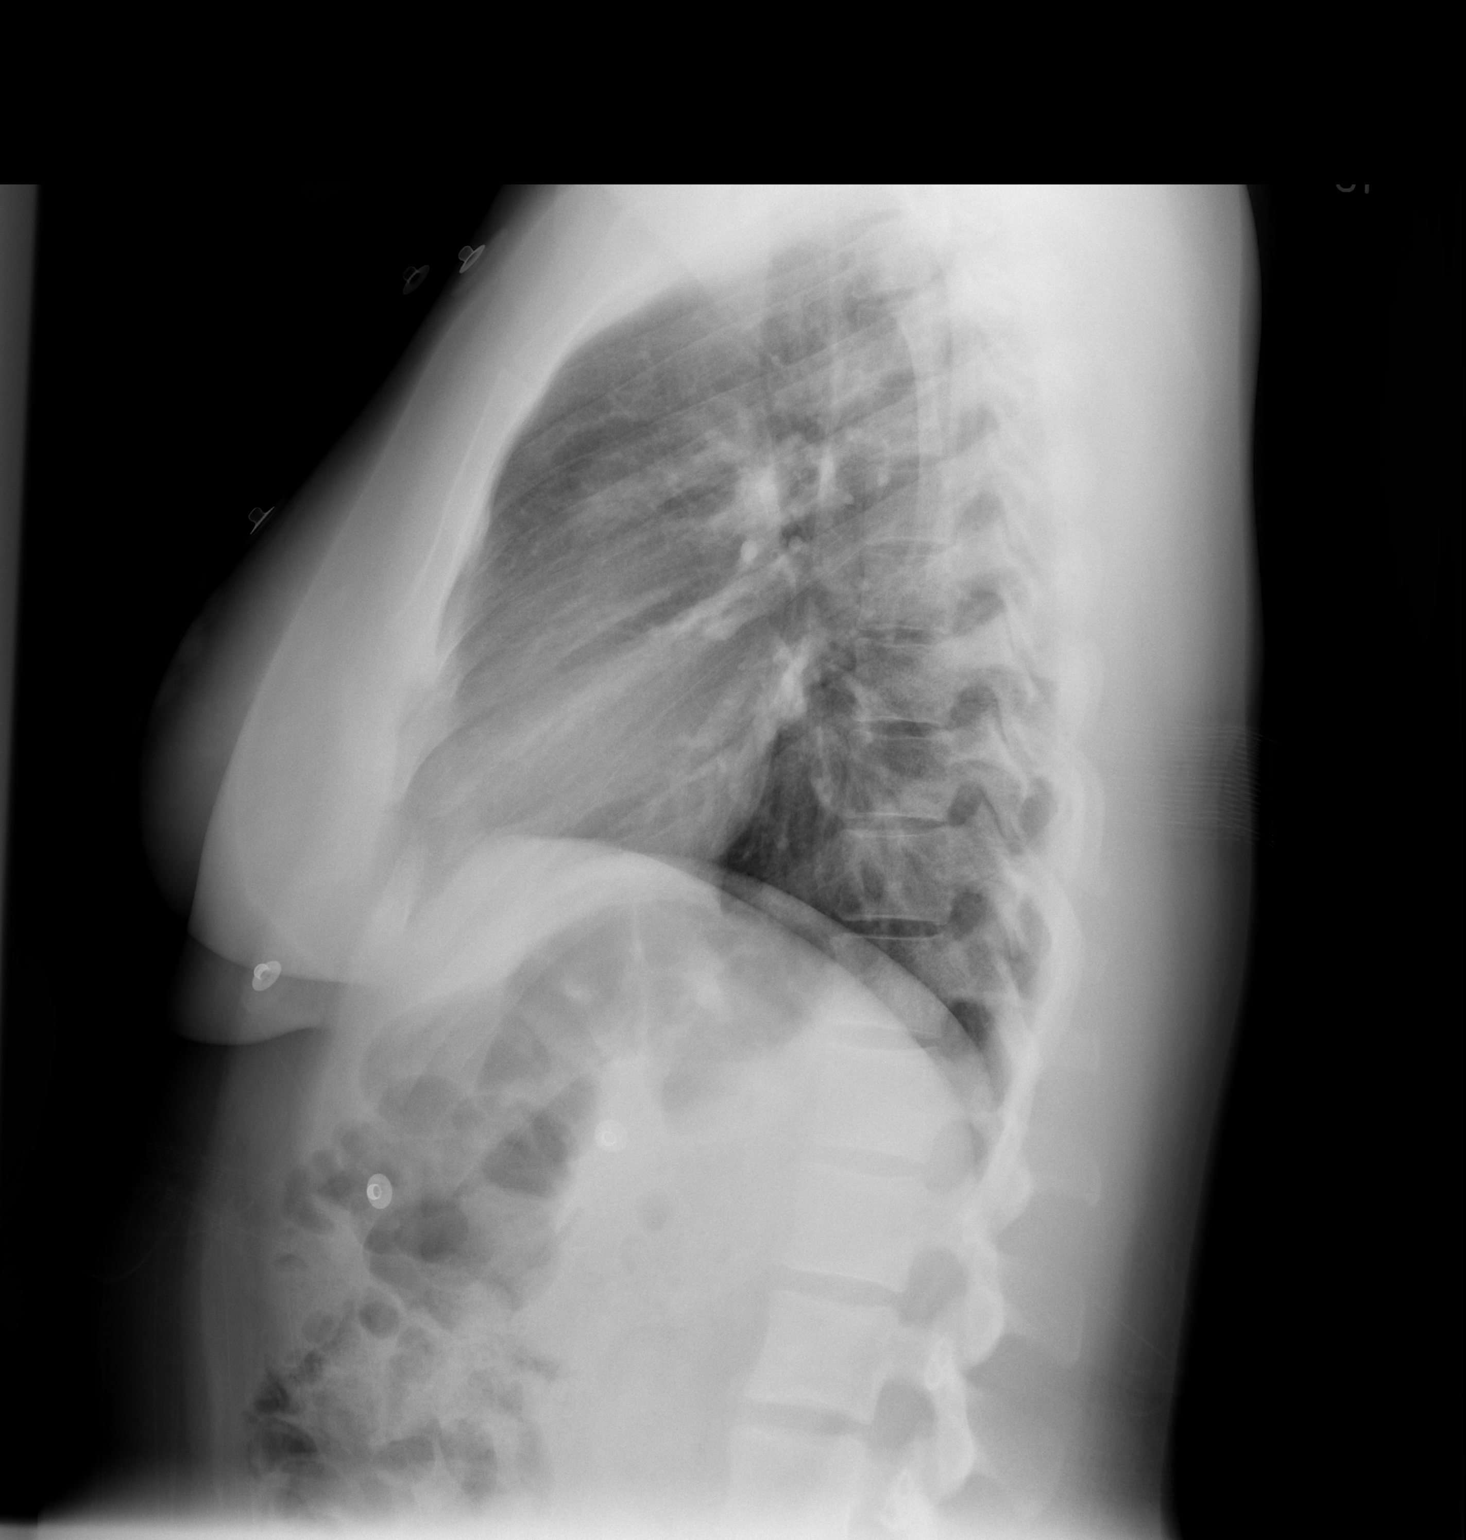

[2 of 2 positions shown; findings below may reference images not displayed]

FINDINGS: Lung volumes are low but the lungs are clear. Heart size is normal.
No pneumothorax or pleural effusion. No focal bony abnormality.
IMPRESSION: Negative chest.
